# Patient Record
Sex: Male | Born: 1955 | Race: Black or African American | Hispanic: No | Marital: Married | Smoking: Never smoker
Health system: Southern US, Community
[De-identification: ages and names within clinical notes are randomized; demographics above are authoritative.]

## PROBLEM LIST (undated history)

## (undated) DIAGNOSIS — I10 Essential (primary) hypertension: Secondary | ICD-10-CM

## (undated) DIAGNOSIS — E119 Type 2 diabetes mellitus without complications: Secondary | ICD-10-CM

---

## 2019-02-24 ENCOUNTER — Other Ambulatory Visit: Payer: Self-pay | Admitting: Internal Medicine

## 2019-02-24 ENCOUNTER — Ambulatory Visit
Admission: RE | Admit: 2019-02-24 | Discharge: 2019-02-24 | Disposition: A | Discharge status: Home / Self Care | Payer: Self-pay | Source: Ambulatory Visit | Attending: Internal Medicine | Admitting: Internal Medicine

## 2019-02-24 DIAGNOSIS — M19041 Primary osteoarthritis, right hand: Secondary | ICD-10-CM

## 2020-09-12 ENCOUNTER — Ambulatory Visit: Payer: No Typology Code available for payment source | Admitting: Podiatry

## 2020-09-12 ENCOUNTER — Encounter: Payer: Self-pay | Admitting: Podiatry

## 2020-09-12 ENCOUNTER — Other Ambulatory Visit: Payer: Self-pay

## 2020-09-12 DIAGNOSIS — Z9989 Dependence on other enabling machines and devices: Secondary | ICD-10-CM | POA: Insufficient documentation

## 2020-09-12 DIAGNOSIS — E782 Mixed hyperlipidemia: Secondary | ICD-10-CM | POA: Insufficient documentation

## 2020-09-12 DIAGNOSIS — L84 Corns and callosities: Secondary | ICD-10-CM | POA: Diagnosis not present

## 2020-09-12 DIAGNOSIS — M109 Gout, unspecified: Secondary | ICD-10-CM | POA: Insufficient documentation

## 2020-09-12 DIAGNOSIS — E1136 Type 2 diabetes mellitus with diabetic cataract: Secondary | ICD-10-CM

## 2020-09-12 DIAGNOSIS — Z860100 Personal history of colon polyps, unspecified: Secondary | ICD-10-CM | POA: Insufficient documentation

## 2020-09-12 DIAGNOSIS — H9312 Tinnitus, left ear: Secondary | ICD-10-CM | POA: Insufficient documentation

## 2020-09-12 DIAGNOSIS — E1165 Type 2 diabetes mellitus with hyperglycemia: Secondary | ICD-10-CM | POA: Insufficient documentation

## 2020-09-12 DIAGNOSIS — I10 Essential (primary) hypertension: Secondary | ICD-10-CM | POA: Insufficient documentation

## 2020-09-12 DIAGNOSIS — H9313 Tinnitus, bilateral: Secondary | ICD-10-CM | POA: Insufficient documentation

## 2020-09-12 DIAGNOSIS — R209 Unspecified disturbances of skin sensation: Secondary | ICD-10-CM | POA: Insufficient documentation

## 2020-09-12 DIAGNOSIS — M19049 Primary osteoarthritis, unspecified hand: Secondary | ICD-10-CM | POA: Insufficient documentation

## 2020-09-12 DIAGNOSIS — G4730 Sleep apnea, unspecified: Secondary | ICD-10-CM | POA: Insufficient documentation

## 2020-09-12 NOTE — Progress Notes (Signed)
This patient presents to the office concerned about a black and callused area on the back of his right heel.  He says this has been present for the last few months and feels that the blackish discoloration is worsening.  He says he is having no pain or discomfort associated with this callus.  This patient is a diabetic and a patient of Dr. Sharl Ma.  He states that he believes this has become aggravated through his exercise program at home.  He presents to the office today for an evaluation and treatment of this condition.  Vascular  Dorsalis pedis and posterior tibial pulses are weakly  palpable  B/L.  Capillary return  WNL.  Temperature gradient is  WNL.  Skin turgor  WNL  Sensorium  Senn Weinstein monofilament wire  WNL. Normal tactile sensation.  Nail Exam  Patient has normal nails with no evidence of bacterial or fungal infection.  Orthopedic  Exam  Muscle tone and muscle strength  WNL.  No limitations of motion feet  B/L.  No crepitus or joint effusion noted.  Foot type is unremarkable and digits show no abnormalities.  HAV  B/L.  Skin  No open lesions.  Normal skin texture and turgor.  Hemmorhagic callus right heel on plantar lateral aspect right heel.  Callus right heel.  IE  Debride callus with dremel tool.  Told to use pumice stone.  Patient states he has changed his exercise shoes. Told him to return in future for continued diabetic foot exams. Patient has no evidence of vascular or neurologic pathology.   Helane Gunther DPM

## 2020-10-21 ENCOUNTER — Encounter (HOSPITAL_COMMUNITY): Payer: Self-pay

## 2020-10-21 ENCOUNTER — Emergency Department (HOSPITAL_COMMUNITY)
Admission: EM | Admit: 2020-10-21 | Discharge: 2020-10-21 | Disposition: A | Discharge status: Home / Self Care | Payer: No Typology Code available for payment source | Attending: Emergency Medicine | Admitting: Emergency Medicine

## 2020-10-21 ENCOUNTER — Emergency Department (HOSPITAL_COMMUNITY): Payer: No Typology Code available for payment source

## 2020-10-21 DIAGNOSIS — R519 Headache, unspecified: Secondary | ICD-10-CM

## 2020-10-21 DIAGNOSIS — I10 Essential (primary) hypertension: Secondary | ICD-10-CM | POA: Insufficient documentation

## 2020-10-21 DIAGNOSIS — Z794 Long term (current) use of insulin: Secondary | ICD-10-CM | POA: Insufficient documentation

## 2020-10-21 DIAGNOSIS — M25552 Pain in left hip: Secondary | ICD-10-CM | POA: Diagnosis not present

## 2020-10-21 DIAGNOSIS — Z79899 Other long term (current) drug therapy: Secondary | ICD-10-CM | POA: Insufficient documentation

## 2020-10-21 DIAGNOSIS — Z7984 Long term (current) use of oral hypoglycemic drugs: Secondary | ICD-10-CM | POA: Diagnosis not present

## 2020-10-21 DIAGNOSIS — E1136 Type 2 diabetes mellitus with diabetic cataract: Secondary | ICD-10-CM | POA: Insufficient documentation

## 2020-10-21 DIAGNOSIS — E1165 Type 2 diabetes mellitus with hyperglycemia: Secondary | ICD-10-CM | POA: Insufficient documentation

## 2020-10-21 DIAGNOSIS — Z7982 Long term (current) use of aspirin: Secondary | ICD-10-CM | POA: Diagnosis not present

## 2020-10-21 DIAGNOSIS — R030 Elevated blood-pressure reading, without diagnosis of hypertension: Secondary | ICD-10-CM

## 2020-10-21 HISTORY — DX: Essential (primary) hypertension: I10

## 2020-10-21 HISTORY — DX: Type 2 diabetes mellitus without complications: E11.9

## 2020-10-21 LAB — CBC WITH DIFFERENTIAL/PLATELET
Abs Immature Granulocytes: 0.01 10*3/uL (ref 0.00–0.07)
Basophils Absolute: 0 10*3/uL (ref 0.0–0.1)
Basophils Relative: 0 %
Eosinophils Absolute: 0.1 10*3/uL (ref 0.0–0.5)
Eosinophils Relative: 2 %
HCT: 44.1 % (ref 39.0–52.0)
Hemoglobin: 14 g/dL (ref 13.0–17.0)
Immature Granulocytes: 0 %
Lymphocytes Relative: 20 %
Lymphs Abs: 1.2 10*3/uL (ref 0.7–4.0)
MCH: 27.3 pg (ref 26.0–34.0)
MCHC: 31.7 g/dL (ref 30.0–36.0)
MCV: 86.1 fL (ref 80.0–100.0)
Monocytes Absolute: 1 10*3/uL (ref 0.1–1.0)
Monocytes Relative: 15 %
Neutro Abs: 3.9 10*3/uL (ref 1.7–7.7)
Neutrophils Relative %: 63 %
Platelets: 168 10*3/uL (ref 150–400)
RBC: 5.12 MIL/uL (ref 4.22–5.81)
RDW: 14.7 % (ref 11.5–15.5)
WBC: 6.2 10*3/uL (ref 4.0–10.5)
nRBC: 0 % (ref 0.0–0.2)

## 2020-10-21 LAB — URINALYSIS, ROUTINE W REFLEX MICROSCOPIC
Bacteria, UA: NONE SEEN
Bilirubin Urine: NEGATIVE
Glucose, UA: >=500 mg/dL — AB
Hgb urine dipstick: NEGATIVE
Ketones, ur: NEGATIVE mg/dL
Leukocytes,Ua: NEGATIVE
Nitrite: NEGATIVE
Protein, ur: NEGATIVE mg/dL
Specific Gravity, Urine: 1.014 (ref 1.005–1.030)
pH: 6 (ref 5.0–8.0)

## 2020-10-21 LAB — COMPREHENSIVE METABOLIC PANEL
ALT: 12 U/L (ref 0–44)
AST: 19 U/L (ref 15–41)
Albumin: 4.1 g/dL (ref 3.5–5.0)
Alkaline Phosphatase: 47 U/L (ref 38–126)
Anion gap: 10 (ref 5–15)
BUN: 15 mg/dL (ref 8–23)
CO2: 27 mmol/L (ref 22–32)
Calcium: 9.4 mg/dL (ref 8.9–10.3)
Chloride: 101 mmol/L (ref 98–111)
Creatinine, Ser: 1.25 mg/dL — ABNORMAL HIGH (ref 0.61–1.24)
GFR, Estimated: >60 mL/min (ref >60)
Glucose, Bld: 99 mg/dL (ref 70–99)
Potassium: 4.2 mmol/L (ref 3.5–5.1)
Sodium: 138 mmol/L (ref 135–145)
Total Bilirubin: 0.5 mg/dL (ref 0.3–1.2)
Total Protein: 7.8 g/dL (ref 6.5–8.1)

## 2020-10-21 LAB — TROPONIN I (HIGH SENSITIVITY): Troponin I (High Sensitivity): 6 ng/L (ref <18)

## 2020-10-21 MED ORDER — AMLODIPINE BESYLATE 5 MG PO TABS
10.0000 mg | ORAL_TABLET | Freq: Once | ORAL | Status: AC
  Administered 2020-10-21: 10 mg via ORAL
  Filled 2020-10-21: qty 2

## 2020-10-21 MED ORDER — DIPHENHYDRAMINE HCL 50 MG/ML IJ SOLN
12.5000 mg | Freq: Once | INTRAMUSCULAR | Status: AC
Start: 2020-10-21 — End: 2020-10-21
  Administered 2020-10-21: 12.5 mg via INTRAVENOUS
  Filled 2020-10-21: qty 1

## 2020-10-21 MED ORDER — PROCHLORPERAZINE EDISYLATE 10 MG/2ML IJ SOLN
10.0000 mg | Freq: Once | INTRAMUSCULAR | Status: AC
  Administered 2020-10-21: 10 mg via INTRAVENOUS
  Filled 2020-10-21: qty 2

## 2020-10-21 MED ORDER — METOPROLOL SUCCINATE ER 50 MG PO TB24
50.0000 mg | ORAL_TABLET | Freq: Every day | ORAL | Status: DC
  Administered 2020-10-21: 50 mg via ORAL
  Filled 2020-10-21: qty 1

## 2020-10-21 MED ORDER — LISINOPRIL 20 MG PO TABS
40.0000 mg | ORAL_TABLET | Freq: Once | ORAL | Status: AC
  Administered 2020-10-21: 40 mg via ORAL
  Filled 2020-10-21: qty 2

## 2020-10-21 MED ORDER — SODIUM CHLORIDE 0.9 % IV BOLUS
1000.0000 mL | Freq: Once | INTRAVENOUS | Status: AC
  Administered 2020-10-21: 1000 mL via INTRAVENOUS

## 2020-10-21 MED ORDER — KETOROLAC TROMETHAMINE 15 MG/ML IJ SOLN
15.0000 mg | Freq: Once | INTRAMUSCULAR | Status: AC
  Administered 2020-10-21: 15 mg via INTRAVENOUS
  Filled 2020-10-21: qty 1

## 2020-10-21 NOTE — ED Provider Notes (Signed)
[facility]-EMERGENCY DEPT Provider Note   CSN: [id number] Arrival date & time: 10/21/20  0518     History Chief Complaint  Patient presents with  . Hypertension    Gregory PughKelvin Rasmussen is a 65 y.o. male with a past medical history significant for diabetes and hypertension who presents to the ED due to concerns about elevated blood pressure associated with headache and blurred vision x1 day.  Patient states he has had a persistent left-sided dull headache that radiates into the left side of neck.  Patient states headache occasionally becomes throbbing in nature associated with bilateral blurred vision.  Patient notes he checked his blood pressure when his headache began which was elevated in the 170s/80s.  He is currently on lisinopril, amlodipine, and metoprolol which he has been compliant with.  He took over-the-counter ibuprofen for his headache with no relief in symptoms.  Patient denies changes to speech, unilateral weakness, and dizziness.  He notes his blood pressures typically run in the 130s/70s.  Denies chest pain and shortness of breath.  No difficulties urinating.  No aggravating or alleviating symptoms.  Last took over-the-counter pain medication last night.  Patient also admits to left-sided hip pain x4 days.  Patient states pain started after walking 4 miles. He notes he has increased his physical activity recently.  Denies radiation of pain.  Denies fever and chills.  No history of IV drug use.  Denies numbness/tingling left lower extremity.  Denies saddle paresthesias, bowel/bladder incontinence, lower extremity weakness, lower extremity numbness/tingling.  No injury to left hip. Taking OTC NSAIDs for hip pain with moderate relief.  History obtained from patient and past medical records. No interpreter used during encounter.      Past Medical History:  Diagnosis Date  . Diabetes mellitus without complication (HCC)   . Hypertension     Patient Active Problem  List   Diagnosis Date Noted  . 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 Acute gout of right hand 09/12/2020  . Bilateral tinnitus 09/12/2020  . 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 Essential hypertension 09/12/2020  . Gout 09/12/2020  . Hyperglycemia due to type 2 diabetes mellitus (HCC) 09/12/2020  . Mixed hyperlipidemia 09/12/2020  . Localized, primary osteoarthritis of hand 09/12/2020  . Morbid obesity (HCC) 09/12/2020  . Sleep apnea 09/12/2020  . Personal history of colonic polyps 09/12/2020  . Skin sensation disturbance 09/12/2020  . Callus of heel 09/12/2020    No past surgical history on file.     No family history on file.  Social History   Tobacco Use  . Smoking status: Never Smoker  . Smokeless tobacco: Current User  Substance Use Topics  . Alcohol use: Not Currently  . Drug use: Not Currently    Home Medications Prior to Admission medications   Medication Sig Start Date End Date Taking? Authorizing Provider  amLODipine (NORVASC) 10 MG tablet Take 1 tablet by mouth daily.   Yes [provider]  aspirin 81 MG chewable tablet Chew 81 mg by mouth at bedtime.   Yes [provider]  Empagliflozin-metFORMIN HCl ER (SYNJARDY XR) 12-998 MG TB24 Take 1 tablet by mouth in the morning and at bedtime. 02/27/20  Yes [provider]  liraglutide (VICTOZA) 18 MG/3ML SOPN Inject 18 mg into the skin daily.   Yes [provider]  lisinopril (ZESTRIL) 40 MG tablet Take 40 mg by mouth daily. 08/29/19  Yes [provider]  metoprolol succinate (  TOPROL-XL) 50 MG 24 hr tablet Take 50 mg by mouth daily.   Yes [provider]  pravastatin (PRAVACHOL) 10 MG tablet Take 10 mg by mouth daily.   Yes [provider]  allopurinol (ZYLOPRIM) 100 MG tablet Take 1 tablet by mouth daily as needed (gout).    [provider]  colchicine 0.6 MG tablet Take 0.6 mg by mouth  daily as needed (gout).    [provider]  melatonin 5 MG TABS Take 5 mg by mouth at bedtime as needed (sleep).    [provider]    Allergies    Patient has no known allergies.  Review of Systems   Review of Systems  Eyes: Positive for visual disturbance.  Respiratory: Negative for shortness of breath.   Cardiovascular: Negative for chest pain.  Gastrointestinal: Negative for abdominal pain, diarrhea, nausea and vomiting.  Neurological: Positive for headaches. Negative for dizziness, facial asymmetry, speech difficulty and weakness.  All other systems reviewed and are negative.   Physical Exam Updated Vital Signs BP (!) 125/57   Pulse 64   Temp 98.1 F (36.7 C) (Oral)   Resp 18   Ht 5\' 9"  (1.753 m)   Wt 108 kg   SpO2 100%   BMI 35.15 kg/m   Physical Exam Vitals and nursing note reviewed.  Constitutional:      General: He is not in acute distress.    Appearance: He is not ill-appearing.  HENT:     Head: Normocephalic.  Eyes:     Extraocular Movements: Extraocular movements intact.     Pupils: Pupils are equal, round, and reactive to light.  Cardiovascular:     Rate and Rhythm: Normal rate and regular rhythm.     Pulses: Normal pulses.     Heart sounds: Normal heart sounds. No murmur heard. No friction rub. No gallop.   Pulmonary:     Effort: Pulmonary effort is normal.     Breath sounds: Normal breath sounds.  Abdominal:     General: Abdomen is flat. Bowel sounds are normal. There is no distension.     Palpations: Abdomen is soft.     Tenderness: There is no abdominal tenderness. There is no guarding or rebound.  Musculoskeletal:     Cervical back: Neck supple.     Comments: Mild bony tenderness over left hip. Full ROM of left hip. Distal pulses and sensation intact.   Skin:    General: Skin is warm and dry.  Neurological:     General: No focal deficit present.     Mental Status: He is alert.     Comments: Speech is clear, able to follow  commands CN III-XII intact Normal strength in upper and lower extremities bilaterally including dorsiflexion and plantar flexion, strong and equal grip strength Sensation grossly intact throughout Moves extremities without ataxia, coordination intact No pronator drift  Psychiatric:        Mood and Affect: Mood normal.        Behavior: Behavior normal.     ED Results / Procedures / Treatments   Labs (all labs ordered are listed, but only abnormal results are displayed) Labs Reviewed  COMPREHENSIVE METABOLIC PANEL - Abnormal; Notable for the following components:      Result Value   Creatinine, Ser 1.25 (*)    All other components within normal limits  URINALYSIS, ROUTINE W REFLEX MICROSCOPIC - Abnormal; Notable for the following components:   Glucose, UA >=500 (*)    All other  components within normal limits  CBC WITH DIFFERENTIAL/PLATELET  TROPONIN I (HIGH SENSITIVITY)    EKG None  Radiology CT Head Wo Contrast  Result Date: 10/21/2020 CLINICAL DATA:  Cerebral hemorrhage suspected Headache Blurred vision since yesterday Hypertension EXAM: CT HEAD WITHOUT CONTRAST TECHNIQUE: Contiguous axial images were obtained from the base of the skull through the vertex without intravenous contrast. COMPARISON:  None. FINDINGS: Brain: No evidence of acute infarction, hemorrhage, hydrocephalus, extra-axial collection or mass lesion/mass effect. Vascular: No hyperdense vessel or unexpected calcification. Skull: Normal. Negative for fracture or focal lesion. Sinuses/Orbits: No acute abnormality. 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 Other: Mild nonspecific thickening of the scalp at the vertex. IMPRESSION: No acute intracranial abnormality. Electronically Signed   By: Acquanetta Belling M.D.   On: 10/21/2020 08:15   DG Hip Unilat W or Wo Pelvis 2-3 Views Left  Result Date: 10/21/2020 CLINICAL DATA:  Worsening left hip pain since workout. EXAM: DG HIP (WITH OR WITHOUT  PELVIS) 2-3V LEFT COMPARISON:  None. FINDINGS: Pelvic enthesophytes. Lumbar spondylosis. No evidence of fracture or bone lesion. Negative for erosion or left hip joint narrowing. IMPRESSION: No acute finding or left hip narrowing. Electronically Signed   By: Marnee Spring M.D.   On: 10/21/2020 07:55    Procedures Procedures   Medications Ordered in ED Medications  metoprolol succinate (TOPROL-XL) 24 hr tablet 50 mg (50 mg Oral Given 10/21/20 0904)  prochlorperazine (COMPAZINE) injection 10 mg (10 mg Intravenous Given 10/21/20 0737)  diphenhydrAMINE (BENADRYL) injection 12.5 mg (12.5 mg Intravenous Given 10/21/20 0737)  sodium chloride 0.9 % bolus 1,000 mL (0 mLs Intravenous Stopped 10/21/20 0858)  ketorolac (TORADOL) 15 MG/ML injection 15 mg (15 mg Intravenous Given 10/21/20 0904)  amLODipine (NORVASC) tablet 10 mg (10 mg Oral Given 10/21/20 0904)  lisinopril (ZESTRIL) tablet 40 mg (40 mg Oral Given 10/21/20 1607)    ED Course  I have reviewed the triage vital signs and the nursing notes.  Pertinent labs & imaging results that were available during my care of the patient were reviewed by me and considered in my medical decision making (see chart for details).  Clinical Course as of 10/21/20 1103  Mon Oct 21, 2020  0638 BP(!): 163/70 [CA]    Clinical Course User Index [CA] Mannie Stabile, PA-C   MDM Rules/Calculators/A&P                         65 year old male presents to the ED due to elevated blood pressure associated with left-sided headache and blurry vision.  Patient has a history of hypertension is currently on lisinopril, amlodipine, metoprolol which she has been compliant with.  Denies chest pain, shortness of breath, dizziness, speech changes, unilateral weakness.  He is not currently on blood thinners.  No head injury.  Upon arrival, BP elevated 187/74 which is improved during my initial evaluation to 163/70.  All other vitals within normal limits.  Patient in no acute  distress and nontoxic-appearing.  Physical exam reassuring.  Normal neurological exam.  Low suspicion for CVA.  Mild bony tenderness over left hip.  No cervical, thoracic, or lumbar midline tenderness.  Left lower extremity neurovascularly intact.  Low suspicion for septic arthritis.  Suspect underlying arthritis vs. strain of left hip due to increased physical activity.  Will obtain x-ray to rule out bony fractures and to check joint space.  Also obtain routine labs to rule out end organ damage.  CT head to rule out intracranial abnormalities due to elevated blood pressures associated with blurred vision.  IV fluids, Compazine, and Benadryl given.  We will hold Toradol until CT head results become available.  Patient agreeable to plan.  CBC unremarkable with no leukocytosis and normal hemoglobin.  CMP reassuring.  Mild elevation in creatinine at 1.25 with normal BUN.  No major electrolyte derangements.  Troponin normal at 6.  Low suspicion for ACS.  Patient denies chest pain.  No delta troponin warranted at this time.  CT head personally reviewed which is negative for any acute abnormalities.  Low suspicion for [facility].  Left hip x-ray personally reviewed which is negative for any acute abnormalities. No signs of end organ damage. Low suspicion for HTN emergency. Elevated BP could be related to NSAIDs for left hip pain. Discussed case with Dr. Donnald Garre who evaluated patient at bedside and agrees with assessment and plan.   8:46 AM reassessed patient at bedside who notes almost full resolution in headache.  BP elevated at 200/89.  Patient notes he is due for his blood pressure medications.  Will give home dose here in the ED and monitor blood pressure.  IV Toradol given.  Patient calling PCP to schedule appointment for further evaluation of blood pressure.  11:00 AM reassessed patient at bedside.  Patient's BP improved to 125/57.  Patient admits to resolution in headache after migraine cocktail.  Low suspicion for  hypertensive emergency.  Instructed patient to take Tylenol as needed for pain to prevent further elevation of BP.  Follow-up with PCP within the next few days to recheck blood pressure. Strict ED precautions discussed with patient. Patient states understanding and agrees to plan. Patient discharged home in no acute distress and stable vitals  Final Clinical Impression(s) / ED Diagnoses Final diagnoses:  Elevated blood pressure reading  Acute nonintractable headache, unspecified headache type    Rx / DC Orders ED Discharge Orders    None       Jesusita Oka 10/21/20 1103    Arby Barrette, MD 10/21/20 1718

## 2020-10-21 NOTE — ED Provider Notes (Signed)
I provided a substantive portion of the care of this patient.  I personally performed the entirety of the history for this encounter.    Patient was blood pressure has been elevated.  He had a headache this morning.  Headache was intense frontal headache aching in nature.  Reports his vision was also slightly blurred.  No focal weakness numbness or tingling.  Patient reports he measured his blood pressures and they were elevated up to 170 systolic.  He reports this is very atypical for him.  Typically blood pressures are controlled on his medications in the 130s.  He does note that at the beginning of the week he exercised pretty aggressively on the treadmill and then developed left hip pain.  He has been taking frequent and high doses of ibuprofen.  He reports that the pain in the hip is starting to improve now.  Patient is alert and appropriate.  No confusion.  All movements coordinated purposeful symmetric.  CT head negative.  Diagnostic studies within normal limits.  At this time appears most likely patient's hypertensive urgency precipitated by recent high-dose use of NSAIDs.  Patient is alert and appropriate.  No signs of encephalopathy.  At this time will administer his a.m. blood pressures, recheck blood pressure and anticipate likely discharge with close follow-up.   Arby Barrette, MD 10/21/20 [phone]

## 2020-10-21 NOTE — ED Triage Notes (Signed)
Pt came via POV with HTN. Pt states his B/P was 170s/90s at home. He is c/o headache and blurred vision and states it woke him up out of his sleep. Pt pressure upon arrival 180s/70s.

## 2020-10-21 NOTE — Discharge Instructions (Signed)
As discussed, all of your labs were reassuring today.  Your CT head was negative for any acute abnormalities.  Continue to take Tylenol as needed for your left hip pain.  Please follow-up with PCP within the next few days to recheck blood pressure.  Take blood pressure medications as prescribed.  Return to the ER for new or worsening symptoms.

## 2021-09-08 DIAGNOSIS — E1169 Type 2 diabetes mellitus with other specified complication: Secondary | ICD-10-CM | POA: Diagnosis not present

## 2021-09-08 DIAGNOSIS — E782 Mixed hyperlipidemia: Secondary | ICD-10-CM | POA: Diagnosis not present

## 2021-09-08 DIAGNOSIS — Z125 Encounter for screening for malignant neoplasm of prostate: Secondary | ICD-10-CM | POA: Diagnosis not present

## 2021-09-08 DIAGNOSIS — M109 Gout, unspecified: Secondary | ICD-10-CM | POA: Diagnosis not present

## 2021-09-08 DIAGNOSIS — Z0000 Encounter for general adult medical examination without abnormal findings: Secondary | ICD-10-CM | POA: Diagnosis not present

## 2021-09-08 DIAGNOSIS — I10 Essential (primary) hypertension: Secondary | ICD-10-CM | POA: Diagnosis not present

## 2021-09-08 DIAGNOSIS — N1831 Chronic kidney disease, stage 3a: Secondary | ICD-10-CM | POA: Diagnosis not present

## 2021-09-17 DIAGNOSIS — E119 Type 2 diabetes mellitus without complications: Secondary | ICD-10-CM | POA: Diagnosis not present

## 2021-09-17 DIAGNOSIS — E1169 Type 2 diabetes mellitus with other specified complication: Secondary | ICD-10-CM | POA: Diagnosis not present

## 2021-09-17 DIAGNOSIS — I10 Essential (primary) hypertension: Secondary | ICD-10-CM | POA: Diagnosis not present

## 2021-09-17 DIAGNOSIS — N1831 Chronic kidney disease, stage 3a: Secondary | ICD-10-CM | POA: Diagnosis not present

## 2021-11-05 DIAGNOSIS — E11319 Type 2 diabetes mellitus with unspecified diabetic retinopathy without macular edema: Secondary | ICD-10-CM | POA: Diagnosis not present

## 2022-03-24 DIAGNOSIS — N1831 Chronic kidney disease, stage 3a: Secondary | ICD-10-CM | POA: Diagnosis not present

## 2022-03-24 DIAGNOSIS — I10 Essential (primary) hypertension: Secondary | ICD-10-CM | POA: Diagnosis not present

## 2022-03-24 DIAGNOSIS — E1169 Type 2 diabetes mellitus with other specified complication: Secondary | ICD-10-CM | POA: Diagnosis not present

## 2022-03-24 DIAGNOSIS — R809 Proteinuria, unspecified: Secondary | ICD-10-CM | POA: Diagnosis not present

## 2022-03-31 DIAGNOSIS — N182 Chronic kidney disease, stage 2 (mild): Secondary | ICD-10-CM | POA: Diagnosis not present

## 2022-03-31 DIAGNOSIS — E1169 Type 2 diabetes mellitus with other specified complication: Secondary | ICD-10-CM | POA: Diagnosis not present

## 2022-03-31 DIAGNOSIS — E119 Type 2 diabetes mellitus without complications: Secondary | ICD-10-CM | POA: Diagnosis not present

## 2022-03-31 DIAGNOSIS — I10 Essential (primary) hypertension: Secondary | ICD-10-CM | POA: Diagnosis not present

## 2022-05-08 DIAGNOSIS — Z23 Encounter for immunization: Secondary | ICD-10-CM | POA: Diagnosis not present

## 2022-05-08 DIAGNOSIS — S90822A Blister (nonthermal), left foot, initial encounter: Secondary | ICD-10-CM | POA: Diagnosis not present

## 2022-05-08 DIAGNOSIS — S90821A Blister (nonthermal), right foot, initial encounter: Secondary | ICD-10-CM | POA: Diagnosis not present

## 2022-05-14 DIAGNOSIS — T148XXA Other injury of unspecified body region, initial encounter: Secondary | ICD-10-CM | POA: Diagnosis not present

## 2022-05-14 DIAGNOSIS — M79673 Pain in unspecified foot: Secondary | ICD-10-CM | POA: Diagnosis not present

## 2022-05-21 DIAGNOSIS — E11621 Type 2 diabetes mellitus with foot ulcer: Secondary | ICD-10-CM | POA: Diagnosis not present

## 2022-05-21 DIAGNOSIS — E119 Type 2 diabetes mellitus without complications: Secondary | ICD-10-CM | POA: Diagnosis not present

## 2022-05-21 DIAGNOSIS — N182 Chronic kidney disease, stage 2 (mild): Secondary | ICD-10-CM | POA: Diagnosis not present

## 2022-05-22 ENCOUNTER — Other Ambulatory Visit: Payer: Self-pay | Admitting: Internal Medicine

## 2022-05-22 ENCOUNTER — Ambulatory Visit
Admission: RE | Admit: 2022-05-22 | Discharge: 2022-05-22 | Disposition: A | Discharge status: Home / Self Care | Payer: BC Managed Care – PPO | Source: Ambulatory Visit | Attending: Internal Medicine | Admitting: Internal Medicine

## 2022-05-22 DIAGNOSIS — E13621 Other specified diabetes mellitus with foot ulcer: Secondary | ICD-10-CM

## 2022-05-22 DIAGNOSIS — L97419 Non-pressure chronic ulcer of right heel and midfoot with unspecified severity: Secondary | ICD-10-CM

## 2022-05-25 ENCOUNTER — Ambulatory Visit: Payer: BC Managed Care – PPO | Admitting: Podiatry

## 2022-05-25 DIAGNOSIS — E1136 Type 2 diabetes mellitus with diabetic cataract: Secondary | ICD-10-CM | POA: Diagnosis not present

## 2022-05-25 DIAGNOSIS — L97412 Non-pressure chronic ulcer of right heel and midfoot with fat layer exposed: Secondary | ICD-10-CM

## 2022-05-25 DIAGNOSIS — L97421 Non-pressure chronic ulcer of left heel and midfoot limited to breakdown of skin: Secondary | ICD-10-CM | POA: Diagnosis not present

## 2022-05-25 IMAGING — CT CT HEAD W/O CM
3 series · 14 of 47 positions shown, 16 images · non-contrast
Comparison: None.

CLINICAL DATA: Cerebral hemorrhage suspected

Headache
Blurred vision since yesterday
Hypertension
EXAM:
CT HEAD WITHOUT CONTRAST
TECHNIQUE: Contiguous axial images were obtained from the base of the skull
through the vertex without intravenous contrast.

[Series 2: head wo · axial · 0.47mm/px · z∈[-106,+24]mm · 8 of 32 slices shown, 10 images]
[im 3/32  brain]
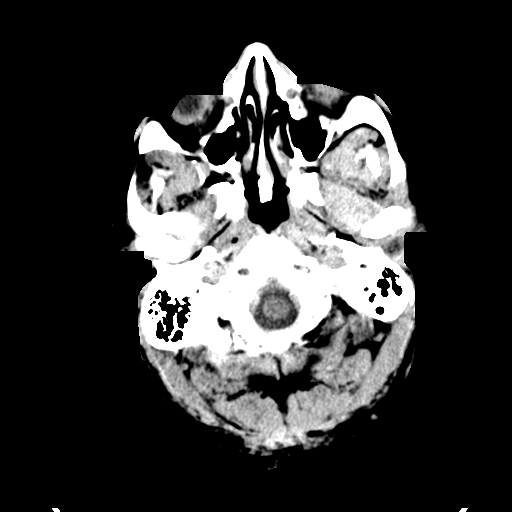
[im 3/32  bone]
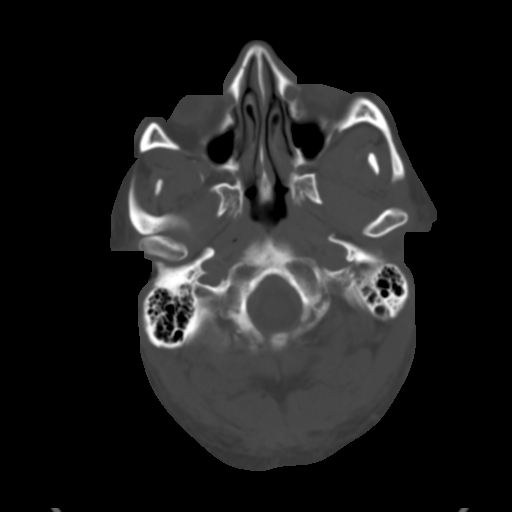
[im 7/32  brain]
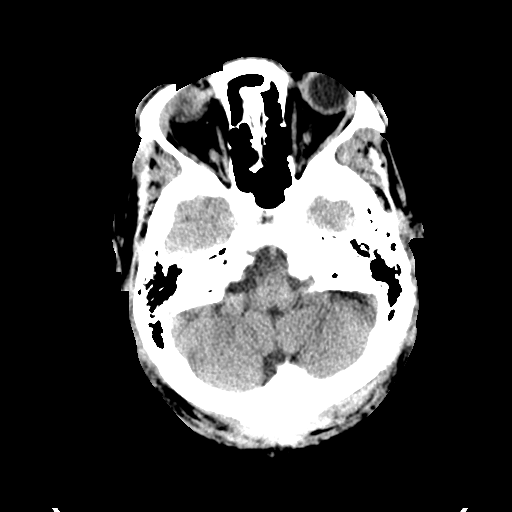
[im 10/32  brain]
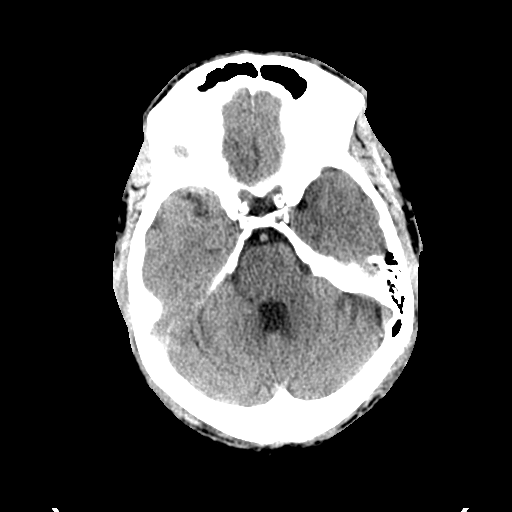
[im 14/32  brain]
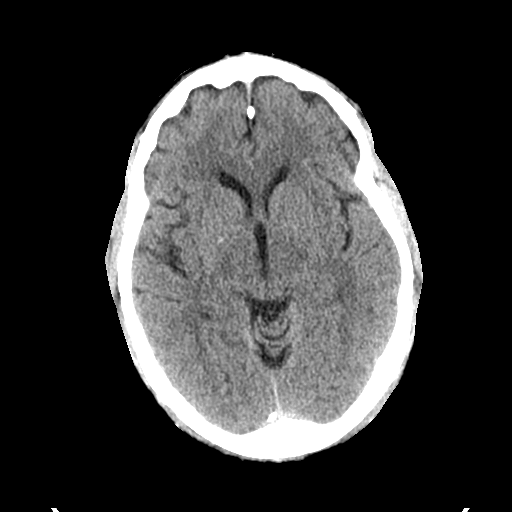
[im 18/32  brain]
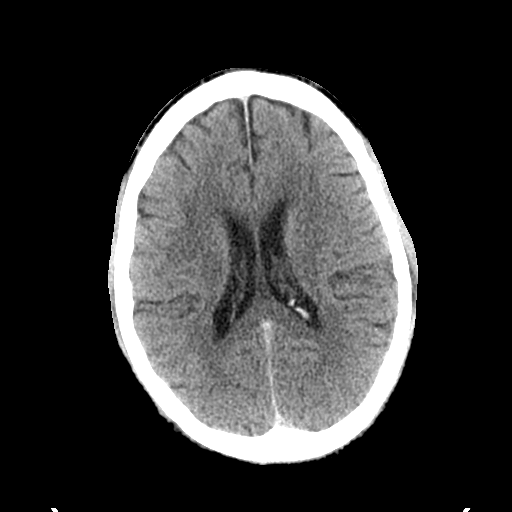
[im 18/32  bone]
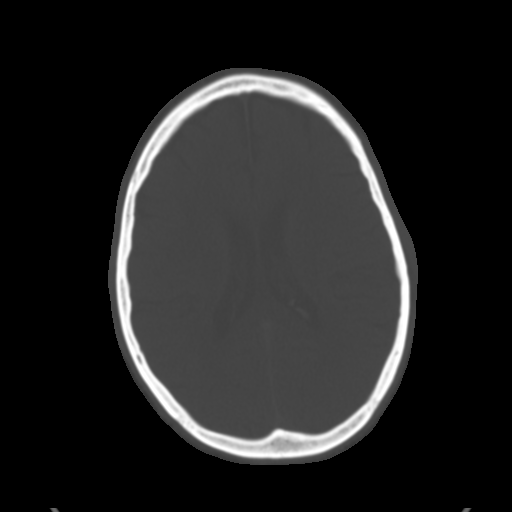
[im 22/32  brain]
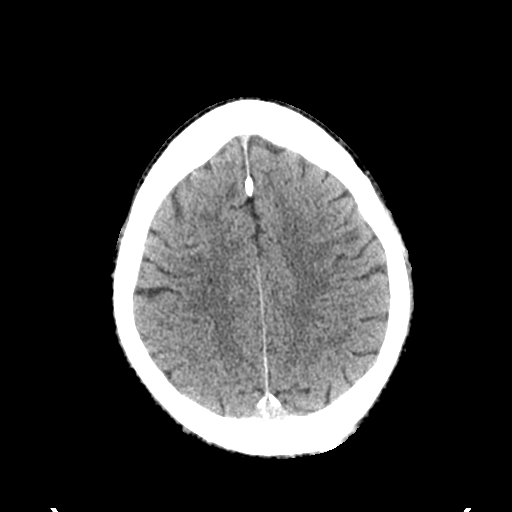
[im 25/32  brain]
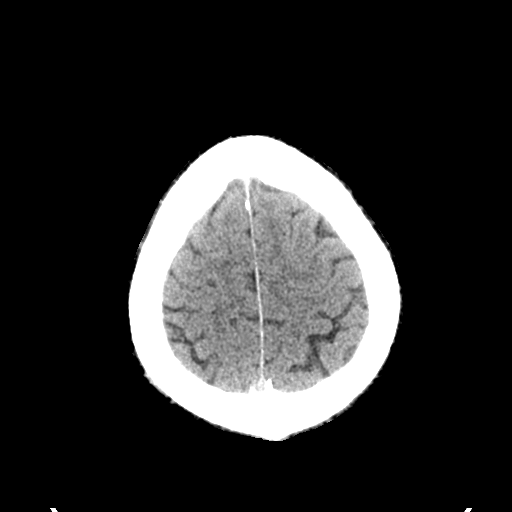
[im 29/32  brain]
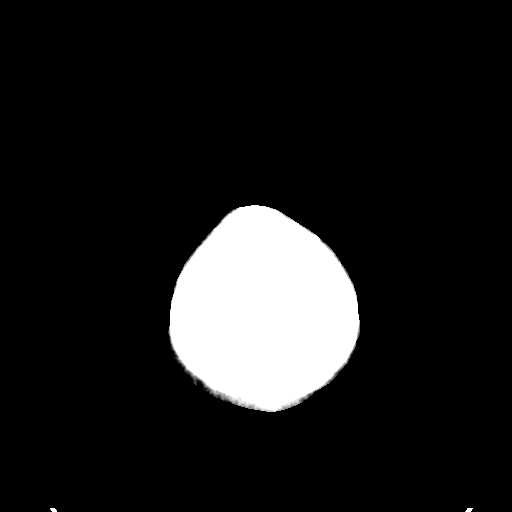

[Series 5: coronal soft tissue · coronal · 0.33mm/px · 3 of 72 slices shown]
[im 24/72  brain]
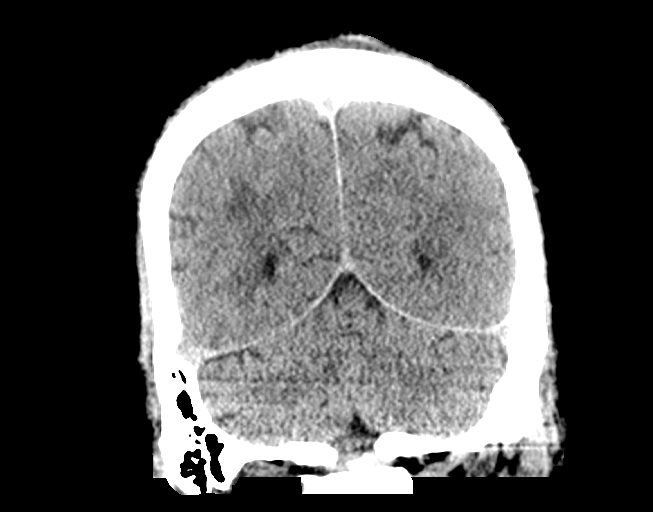
[im 32/72  brain]
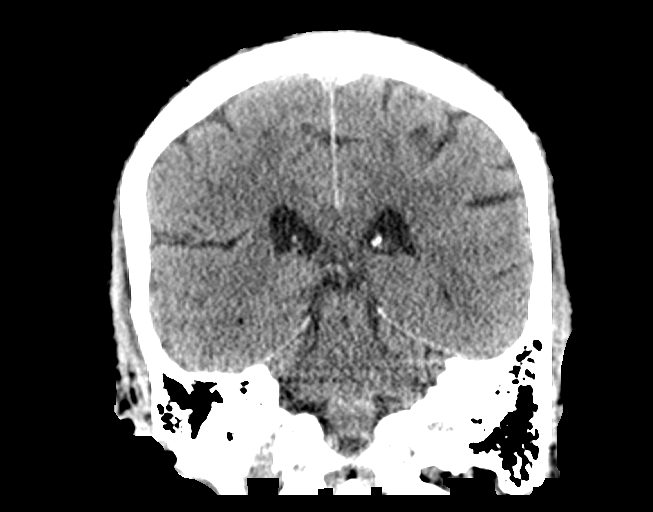
[im 40/72  brain]
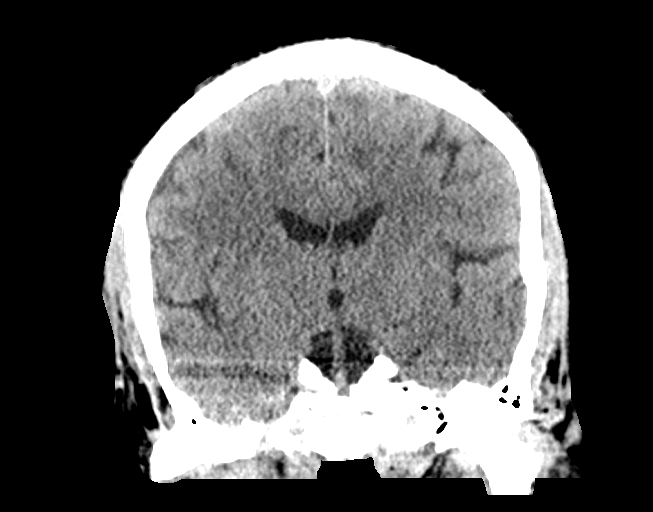

[Series 6: sagittal soft tissue · sagittal · 0.31mm/px · 3 of 62 slices shown]
[im 21/62  brain]
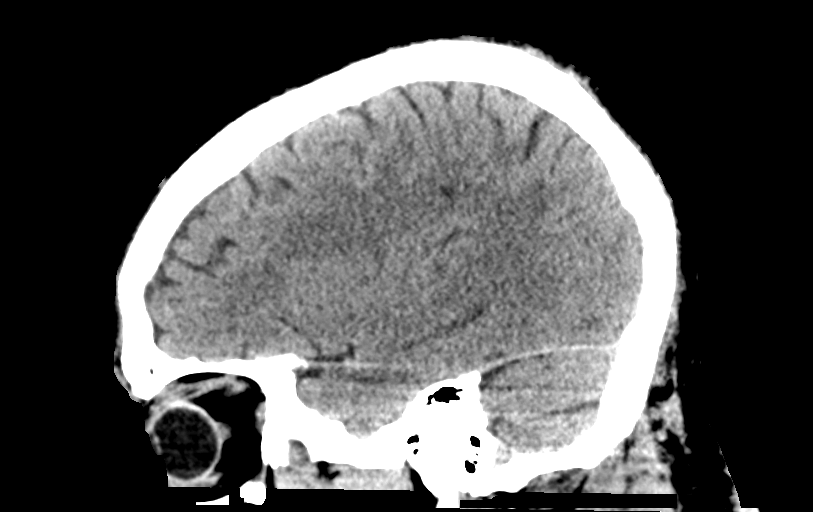
[im 31/62  brain]
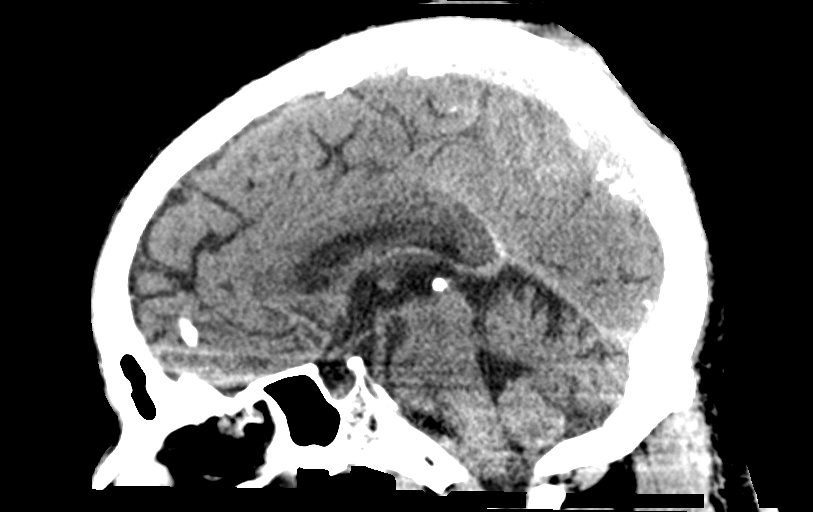
[im 41/62  brain]
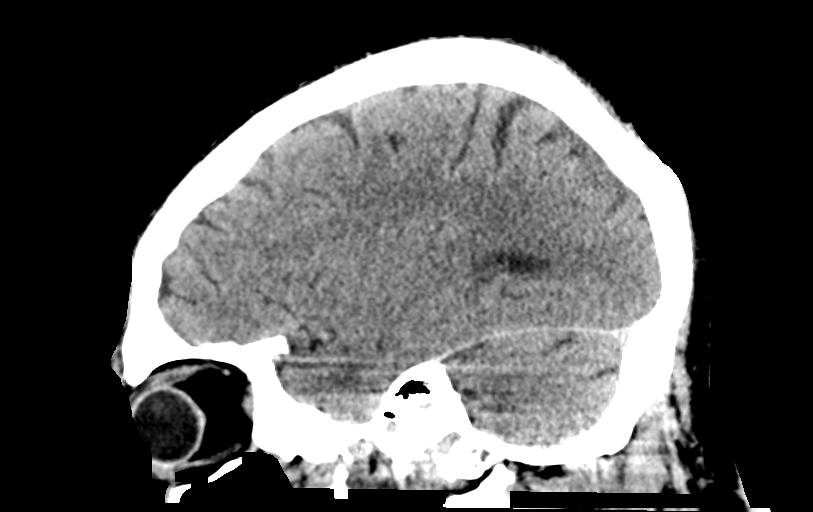

[14 of 47 positions shown; findings below may reference images not displayed]

FINDINGS: Brain: No evidence of acute infarction, hemorrhage, hydrocephalus,
extra-axial collection or mass lesion/mass effect.

Vascular: No hyperdense vessel or unexpected calcification.

Skull: Normal. Negative for fracture or focal lesion.

Sinuses/Orbits: No acute abnormality. 1.3 cm calcified mass in the
right frontal sinus likely an osteoma.

Other: Mild nonspecific thickening of the scalp at the vertex.
IMPRESSION: No acute intracranial abnormality.

## 2022-05-25 NOTE — Progress Notes (Signed)
Subjective:  Patient ID: [facility], male    DOB: 10-May-1956,  MRN: [id number]  Chief Complaint  Patient presents with   Callouses    Pre-ulcerative callus verses ulcer of both heel - patient has seen Dr. Stacie Acres in the past    66 y.o. male presents with the above complaint. History confirmed with patient.  He returns for a new issue that developed about 5 weeks ago he wore a an over-the-counter insert in his shoes and was on a hike and noticed that he developed blistering on the heel the right was much worse on the left at this caused a break in the skin and the blister expanded and went up into the ankles.  He saw his endocrinologist for this and he put him on antibiotics and took x-rays and took lab work.  His A1c is well controlled and always has been, he is no longer on insulin  Objective:  Physical Exam: warm, good capillary refill, no trophic changes or ulcerative lesions, normal DP and PT pulses, normal monofilament exam, normal sensory exam, and bilateral heel ulcerations noted below measuring 0.6 x 0.6 cm x 0.1 cm and limited to breakdown of skin on the left side, right side is 4.5 x 3.3 x 0.2 cm with exposed subcutaneous tissue, appears to be healing well there is large hyperkeratosis surrounding this no cellulitis purulence or malodor there is slight serous drainage and tenderness to the area      Radiographs: Multiple views x-ray of both feet: I reviewed his previous radiographs taken by his endocrinologist on 05/22/2022 there is no evidence of osteomyelitis or soft tissue emphysema Assessment:   1. 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 Ulcer of heel, left, limited to breakdown of skin (HCC)   3. Type 2 diabetes mellitus with diabetic cataract, without long-term current use of insulin (HCC)      Plan:  Patient was evaluated and treated and all questions answered.  Ulcer bilateral heel -We discussed the etiology and factors that are a part of  the wound healing process.  We also discussed the risk of infection both soft tissue and osteomyelitis from open ulceration.  Discussed the risk of limb loss if this happens or worsens. -Debridement as below. -Dressed with Medihoney, DSD.  He has been using this at home and he will continue this as well -Continue home dressing changes daily with 4 x 4 gauze and Medihoney -Continue off-loading is much as possible, I think he can continue his regular shoe gear which is a soft open-ended croc type sandal.  We discussed avoiding exercise on his feet for now until these heal -Vascular testing is not indicated he has palpable pulses -HgbA1c: Well-controlled -Last antibiotics: Has completed 2 rounds of antibiotics, I do not think he requires further oral antibiotics at this point -Imaging: x-ray reviewed, shows no signs of erosions, osteolysis, osteomyelitis or emphysema.  Procedure: Excisional Debridement of Wound Rationale: Removal of non-viable soft tissue from the wound to promote healing.  Anesthesia: none Post-Debridement Wound Measurements: 4.5 cm x 0.3 cm x 0.2 cm on the right heel, he has skin breakdown 0.6 cm x 0.6 cm x0.1 cm in the left heel Type of Debridement: Sharp Excisional Tissue Removed: Non-viable soft tissue Depth of Debridement: subcutaneous tissue. Technique: Sharp excisional debridement to bleeding, viable wound base.  Dressing: Dry, sterile, compression dressing. Disposition: Patient tolerated procedure well.    Return in about 1 month (around 06/24/2022) for wound care.  Return in about 1 month (around 06/24/2022) for wound care.

## 2022-05-29 DIAGNOSIS — E11621 Type 2 diabetes mellitus with foot ulcer: Secondary | ICD-10-CM | POA: Diagnosis not present

## 2022-05-29 DIAGNOSIS — N182 Chronic kidney disease, stage 2 (mild): Secondary | ICD-10-CM | POA: Diagnosis not present

## 2022-05-29 DIAGNOSIS — E119 Type 2 diabetes mellitus without complications: Secondary | ICD-10-CM | POA: Diagnosis not present

## 2022-05-29 DIAGNOSIS — E1140 Type 2 diabetes mellitus with diabetic neuropathy, unspecified: Secondary | ICD-10-CM | POA: Diagnosis not present

## 2022-06-23 ENCOUNTER — Encounter: Payer: Self-pay | Admitting: Podiatry

## 2022-06-23 ENCOUNTER — Ambulatory Visit: Payer: BC Managed Care – PPO | Admitting: Podiatry

## 2022-06-23 DIAGNOSIS — L97412 Non-pressure chronic ulcer of right heel and midfoot with fat layer exposed: Secondary | ICD-10-CM | POA: Diagnosis not present

## 2022-06-23 NOTE — Patient Instructions (Signed)
Look for urea 40% cream or ointment and apply to the thickened dry skin / calluses. This can be bought over the counter, at a pharmacy or online such as [employer].  

## 2022-06-24 ENCOUNTER — Telehealth: Payer: Self-pay | Admitting: *Deleted

## 2022-06-24 NOTE — Telephone Encounter (Signed)
Patient is calling for status of the colloidal gel that, was supposed to be sent to his pharmacy,not seeing this in notes,Please advise.

## 2022-06-24 NOTE — Progress Notes (Signed)
  Subjective:  Patient ID: Gregory Rasmussen, male    DOB: 1956/06/08,  MRN: [id number]  Chief Complaint  Patient presents with   Foot Ulcer    Follow up ulcer bilateral plantar heels   "The left one is pretty much healed, the right one still has a scab and very sensitive"    66 y.o. male presents with the above complaint. History confirmed with patient.  He says is doing well the left one no longer drains and has healed, the right still is quite sensitive and has a dark tissue in the middle  Objective:  Physical Exam: warm, good capillary refill, no trophic changes or ulcerative lesions, normal DP and PT pulses, normal monofilament exam, normal sensory exam, and right heel ulceration noted below measuring 3.2x2.2 x 0.2 cm with exposed subcutaneous tissue, appears to be healing well there is large hyperkeratosis, central necrotic patch, surrounding this no cellulitis purulence or malodor there is slight serous drainage and tenderness to the area.  Left heel has healed       Radiographs: Multiple views x-ray of both feet: I reviewed his previous radiographs taken by his endocrinologist on 05/22/2022 there is no evidence of osteomyelitis or soft tissue emphysema Assessment:   1. 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  Ulcer bilateral heel -We discussed the etiology and factors that are a part of the wound healing process.  We also discussed the risk of infection both soft tissue and osteomyelitis from open ulceration.  Discussed the risk of limb loss if this happens or worsens. -Debridement as below. -Dressed with Iodosorb, DSD.  He has been using this at home and he will continue this as well -Continue home dressing changes daily with foam border dressing and Prisma collagen.  Order sent to [facility] for this. -Continue off-loading is much as possible, I think he can continue his  regular shoe gear which is a soft open-ended croc type sandal.  We discussed avoiding exercise on his feet for now until these heal -Vascular testing is not indicated he has palpable pulses -HgbA1c: Well-controlled -Last antibiotics: Has completed 2 rounds of antibiotics, I do not think he requires further oral antibiotics at this point -Imaging: x-ray reviewed, shows no signs of erosions, osteolysis, osteomyelitis or emphysema.  Procedure: Excisional Debridement of Wound Rationale: Removal of non-viable soft tissue from the wound to promote healing.  Anesthesia: none Post-Debridement Wound Measurements: 3.2x2.2 cm x 0.2 cm on the right heel, Type of Debridement: Sharp Excisional Tissue Removed: Non-viable soft tissue Depth of Debridement: subcutaneous tissue. Technique: Sharp excisional debridement to bleeding, viable wound base.  Dressing: Dry, sterile, compression dressing. Disposition: Patient tolerated procedure well.       Return in about 4 weeks (around 07/21/2022) for wound care.

## 2022-06-25 NOTE — Telephone Encounter (Signed)
Called [facility] and spoke with Taylor,no orders for this patient in system, has to be refaxed

## 2022-06-25 NOTE — Telephone Encounter (Signed)
Resent to [facility] again right before lunch time today.

## 2022-06-29 DIAGNOSIS — E1140 Type 2 diabetes mellitus with diabetic neuropathy, unspecified: Secondary | ICD-10-CM | POA: Diagnosis not present

## 2022-06-29 DIAGNOSIS — E11621 Type 2 diabetes mellitus with foot ulcer: Secondary | ICD-10-CM | POA: Diagnosis not present

## 2022-06-29 DIAGNOSIS — E119 Type 2 diabetes mellitus without complications: Secondary | ICD-10-CM | POA: Diagnosis not present

## 2022-06-29 DIAGNOSIS — N182 Chronic kidney disease, stage 2 (mild): Secondary | ICD-10-CM | POA: Diagnosis not present

## 2022-07-21 ENCOUNTER — Ambulatory Visit: Payer: BC Managed Care – PPO | Admitting: Podiatry

## 2022-07-21 DIAGNOSIS — L97412 Non-pressure chronic ulcer of right heel and midfoot with fat layer exposed: Secondary | ICD-10-CM

## 2022-07-21 NOTE — Progress Notes (Signed)
  Subjective:  Patient ID: Gregory Rasmussen, male    DOB: Apr 30, 1956,  MRN: [id number]  Chief Complaint  Patient presents with   Foot Ulcer    4 week follow up right heel    66 y.o. male presents with the above complaint. History confirmed with patient.  Continues to do well he has been using the silver patches  Objective:  Physical Exam: warm, good capillary refill, no trophic changes or ulcerative lesions, normal DP and PT pulses, normal monofilament exam, normal sensory exam, and right heel ulceration noted below measuring 1.5x1.1 x 0.2 cm with exposed subcutaneous tissue, appears to be healing well there is large hyperkeratosis, central necrotic patch, surrounding this no cellulitis purulence or malodor there is slight serous drainage and tenderness to the area.  Left heel has healed       Radiographs: Multiple views x-ray of both feet: I reviewed his previous radiographs taken by his endocrinologist on 05/22/2022 there is no evidence of osteomyelitis or soft tissue emphysema Assessment:   1. 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  Ulcer bilateral heel -We discussed the etiology and factors that are a part of the wound healing process.  We also discussed the risk of infection both soft tissue and osteomyelitis from open ulceration.  Discussed the risk of limb loss if this happens or worsens. -Debridement as below.  Doing very well and is improved about 50% today -Dressed with Prisma, foam border dressing -Continue home dressing changes daily with silver dressings -Continue off-loading is much as possible  -Vascular testing is not indicated he has palpable pulses -HgbA1c: Well-controlled -Last antibiotics: Has completed 2 rounds of antibiotics, I do not think he requires further oral antibiotics at this point -Imaging: x-ray reviewed, shows no signs of erosions, osteolysis,  osteomyelitis or emphysema.  Procedure: Excisional Debridement of Wound Rationale: Removal of non-viable soft tissue from the wound to promote healing.  Anesthesia: none Post-Debridement Wound Measurements: Noted above Type of Debridement: Sharp Excisional Tissue Removed: Non-viable soft tissue Depth of Debridement: subcutaneous tissue. Technique: Sharp excisional debridement to bleeding, viable wound base.  Dressing: Dry, sterile, compression dressing. Disposition: Patient tolerated procedure well.       Return in about 4 weeks (around 08/18/2022) for wound care.

## 2022-08-18 ENCOUNTER — Ambulatory Visit: Payer: BC Managed Care – PPO | Admitting: Podiatry

## 2022-08-18 DIAGNOSIS — L97411 Non-pressure chronic ulcer of right heel and midfoot limited to breakdown of skin: Secondary | ICD-10-CM | POA: Diagnosis not present

## 2022-08-19 NOTE — Progress Notes (Signed)
  Subjective:  Patient ID: Gregory Rasmussen, male    DOB: June 05, 1956,  MRN: [id number]  Chief Complaint  Patient presents with   Foot Ulcer    Follow up right heel    66 y.o. male presents with the above complaint. History confirmed with patient.  Continues to do well   Objective:  Physical Exam: warm, good capillary refill, no trophic changes or ulcerative lesions, normal DP and PT pulses, normal monofilament exam, normal sensory exam, and right heel ulceration is nearly fully healed small area of limited skin breakdown some surrounding hyperkeratosis.  Left heel has healed       Radiographs: Multiple views x-ray of both feet: I reviewed his previous radiographs taken by his endocrinologist on 05/22/2022 there is no evidence of osteomyelitis or soft tissue emphysema Assessment:   1. 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  Ulcer bilateral heel -Overall doing very well his left heel remains well-healed, the right heel is nearly fully healed.  He can leave open to air at this point should heal uneventfully.  We discussed will take some time for the tissue to mature and scar tissue to resolve and sensitivity and pain to improve.  He will return to see me as needed at this point      Return if symptoms worsen or fail to improve.

## 2022-09-15 DIAGNOSIS — N1831 Chronic kidney disease, stage 3a: Secondary | ICD-10-CM | POA: Diagnosis not present

## 2022-09-15 DIAGNOSIS — H9313 Tinnitus, bilateral: Secondary | ICD-10-CM | POA: Diagnosis not present

## 2022-09-15 DIAGNOSIS — Z0000 Encounter for general adult medical examination without abnormal findings: Secondary | ICD-10-CM | POA: Diagnosis not present

## 2022-09-15 DIAGNOSIS — I10 Essential (primary) hypertension: Secondary | ICD-10-CM | POA: Diagnosis not present

## 2022-09-15 DIAGNOSIS — Z125 Encounter for screening for malignant neoplasm of prostate: Secondary | ICD-10-CM | POA: Diagnosis not present

## 2022-09-15 DIAGNOSIS — R21 Rash and other nonspecific skin eruption: Secondary | ICD-10-CM | POA: Diagnosis not present

## 2022-09-15 DIAGNOSIS — E782 Mixed hyperlipidemia: Secondary | ICD-10-CM | POA: Diagnosis not present

## 2022-09-15 DIAGNOSIS — E1169 Type 2 diabetes mellitus with other specified complication: Secondary | ICD-10-CM | POA: Diagnosis not present

## 2022-09-29 DIAGNOSIS — N182 Chronic kidney disease, stage 2 (mild): Secondary | ICD-10-CM | POA: Diagnosis not present

## 2022-09-29 DIAGNOSIS — E11621 Type 2 diabetes mellitus with foot ulcer: Secondary | ICD-10-CM | POA: Diagnosis not present

## 2022-09-29 DIAGNOSIS — E1142 Type 2 diabetes mellitus with diabetic polyneuropathy: Secondary | ICD-10-CM | POA: Diagnosis not present

## 2022-10-14 DIAGNOSIS — H9042 Sensorineural hearing loss, unilateral, left ear, with unrestricted hearing on the contralateral side: Secondary | ICD-10-CM | POA: Diagnosis not present

## 2022-10-14 DIAGNOSIS — H6121 Impacted cerumen, right ear: Secondary | ICD-10-CM | POA: Diagnosis not present

## 2022-10-14 DIAGNOSIS — H9312 Tinnitus, left ear: Secondary | ICD-10-CM | POA: Diagnosis not present

## 2022-10-20 ENCOUNTER — Other Ambulatory Visit: Payer: Self-pay | Admitting: Otolaryngology

## 2022-10-20 DIAGNOSIS — H918X3 Other specified hearing loss, bilateral: Secondary | ICD-10-CM

## 2022-11-17 ENCOUNTER — Ambulatory Visit
Admission: RE | Admit: 2022-11-17 | Discharge: 2022-11-17 | Disposition: A | Discharge status: Home / Self Care | Payer: BC Managed Care – PPO | Source: Ambulatory Visit | Attending: Otolaryngology | Admitting: Otolaryngology

## 2022-11-17 DIAGNOSIS — I6782 Cerebral ischemia: Secondary | ICD-10-CM | POA: Diagnosis not present

## 2022-11-17 DIAGNOSIS — H918X3 Other specified hearing loss, bilateral: Secondary | ICD-10-CM

## 2022-11-17 MED ORDER — GADOPICLENOL 0.5 MMOL/ML IV SOLN
10.0000 mL | Freq: Once | INTRAVENOUS | Status: AC | PRN
  Administered 2022-11-17: 10 mL via INTRAVENOUS

## 2022-11-26 DIAGNOSIS — H9042 Sensorineural hearing loss, unilateral, left ear, with unrestricted hearing on the contralateral side: Secondary | ICD-10-CM | POA: Diagnosis not present

## 2022-11-26 DIAGNOSIS — D333 Benign neoplasm of cranial nerves: Secondary | ICD-10-CM | POA: Diagnosis not present

## 2022-11-30 DIAGNOSIS — H9042 Sensorineural hearing loss, unilateral, left ear, with unrestricted hearing on the contralateral side: Secondary | ICD-10-CM | POA: Diagnosis not present

## 2022-12-01 DIAGNOSIS — E11319 Type 2 diabetes mellitus with unspecified diabetic retinopathy without macular edema: Secondary | ICD-10-CM | POA: Diagnosis not present

## 2023-07-01 ENCOUNTER — Ambulatory Visit (INDEPENDENT_AMBULATORY_CARE_PROVIDER_SITE_OTHER): Payer: BC Managed Care – PPO

## 2023-10-04 DIAGNOSIS — N182 Chronic kidney disease, stage 2 (mild): Secondary | ICD-10-CM | POA: Diagnosis not present

## 2023-10-04 DIAGNOSIS — E1142 Type 2 diabetes mellitus with diabetic polyneuropathy: Secondary | ICD-10-CM | POA: Diagnosis not present

## 2023-10-05 DIAGNOSIS — E782 Mixed hyperlipidemia: Secondary | ICD-10-CM | POA: Diagnosis not present

## 2023-10-05 DIAGNOSIS — E1136 Type 2 diabetes mellitus with diabetic cataract: Secondary | ICD-10-CM | POA: Diagnosis not present

## 2023-10-05 DIAGNOSIS — E785 Hyperlipidemia, unspecified: Secondary | ICD-10-CM | POA: Diagnosis not present

## 2023-10-05 DIAGNOSIS — N1831 Chronic kidney disease, stage 3a: Secondary | ICD-10-CM | POA: Diagnosis not present

## 2023-10-05 DIAGNOSIS — M109 Gout, unspecified: Secondary | ICD-10-CM | POA: Diagnosis not present

## 2023-10-05 DIAGNOSIS — E1142 Type 2 diabetes mellitus with diabetic polyneuropathy: Secondary | ICD-10-CM | POA: Diagnosis not present

## 2023-10-05 DIAGNOSIS — E1169 Type 2 diabetes mellitus with other specified complication: Secondary | ICD-10-CM | POA: Diagnosis not present

## 2023-10-05 DIAGNOSIS — I10 Essential (primary) hypertension: Secondary | ICD-10-CM | POA: Diagnosis not present

## 2023-11-09 ENCOUNTER — Telehealth (INDEPENDENT_AMBULATORY_CARE_PROVIDER_SITE_OTHER): Payer: Self-pay | Admitting: Otolaryngology

## 2023-11-09 NOTE — Telephone Encounter (Signed)
 Confirmed appt & location [id number]

## 2023-11-10 ENCOUNTER — Ambulatory Visit (INDEPENDENT_AMBULATORY_CARE_PROVIDER_SITE_OTHER): Payer: Self-pay | Admitting: Otolaryngology

## 2023-11-10 ENCOUNTER — Encounter (INDEPENDENT_AMBULATORY_CARE_PROVIDER_SITE_OTHER): Payer: Self-pay

## 2023-11-10 VITALS — BP 171/68 | HR 63 | Ht 70.0 in | Wt 225.0 lb

## 2023-11-10 DIAGNOSIS — H9042 Sensorineural hearing loss, unilateral, left ear, with unrestricted hearing on the contralateral side: Secondary | ICD-10-CM

## 2023-11-10 DIAGNOSIS — R42 Dizziness and giddiness: Secondary | ICD-10-CM

## 2023-11-10 DIAGNOSIS — D3610 Benign neoplasm of peripheral nerves and autonomic nervous system, unspecified: Secondary | ICD-10-CM | POA: Diagnosis not present

## 2023-11-10 DIAGNOSIS — H9192 Unspecified hearing loss, left ear: Secondary | ICD-10-CM

## 2023-11-10 DIAGNOSIS — H9312 Tinnitus, left ear: Secondary | ICD-10-CM | POA: Diagnosis not present

## 2023-11-10 DIAGNOSIS — D333 Benign neoplasm of cranial nerves: Secondary | ICD-10-CM

## 2023-11-12 DIAGNOSIS — R42 Dizziness and giddiness: Secondary | ICD-10-CM | POA: Insufficient documentation

## 2023-11-12 DIAGNOSIS — D333 Benign neoplasm of cranial nerves: Secondary | ICD-10-CM | POA: Insufficient documentation

## 2023-11-12 DIAGNOSIS — H9042 Sensorineural hearing loss, unilateral, left ear, with unrestricted hearing on the contralateral side: Secondary | ICD-10-CM | POA: Insufficient documentation

## 2023-11-12 NOTE — Progress Notes (Signed)
 Patient ID: Gregory Rasmussen, male   DOB: [birth year], 68 y.o.   MRN: [id number]  Follow-up: Left ear tinnitus, left ear hearing loss, left ear vestibular schwannoma  HPI: The patient is a 68 year old male who returns today for his follow-up evaluation.  The patient was last seen 1 year ago.  At that time, he was complaining of left ear tinnitus and left ear hearing loss.  He was noted to have asymmetric left ear sensorineural hearing loss.  His left ear tinnitus was likely a result of his hearing loss.  The patient was fitted with a left ear hearing aid.  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  The patient returns today complaining of increasing left ear tinnitus and left ear hearing loss.  He denies any spinning vertigo.  He has occasional balance difficulty.    Exam: General: Communicates without difficulty, well nourished, no acute distress. Head: Normocephalic, no evidence injury, no tenderness, facial buttresses intact without stepoff. Face/sinus: No tenderness to palpation and percussion. Facial movement is normal and symmetric. Eyes: PERRL, EOMI. No scleral icterus, conjunctivae clear. Neuro: CN II exam reveals vision grossly intact.  No nystagmus at any point of gaze. EAC: The ear canals and TMs are noted to be normal. Nose: External evaluation reveals normal support and skin without lesions.  Dorsum is intact.  Anterior rhinoscopy reveals pink mucosa over anterior aspect of inferior turbinates and intact septum.  No purulence noted. Oral:  Oral cavity and oropharynx are intact, symmetric, without erythema or edema.  Mucosa is moist without lesions. Neck: Full range of motion without pain.  There is no significant lymphadenopathy.  No masses palpable.  Thyroid bed within normal limits to palpation.  Parotid glands and submandibular glands equal bilaterally without mass.  Trachea is midline. Neuro:  CN 2-12 grossly intact. Gait normal.   Assessment: 1.  Left  ear vestibular schwannoma. 2.  Worsening asymmetric left ear hearing loss and left ear tinnitus. 3.  His ear canals, tympanic membranes, and middle ear spaces are normal.  Plan:  1.  The physical exam findings are reviewed with the patient. 2.  The pathophysiology of vestibular schwannoma is discussed with the patient. 3.  Repeat MRI scan to evaluate the stability of his left ear vestibular schwannoma. 3.  The treatment options for the vestibular schwannoma are discussed.  The options include conservative observation, surgical resection, or gamma knife radiation.  The pros and cons of the different treatment options are reviewed.  4.  The patient should continue the use of his left ear hearing aid. 5.  The patient will return for reevaluation after his MRI scan.

## 2023-11-15 ENCOUNTER — Ambulatory Visit (HOSPITAL_COMMUNITY)
Admission: RE | Admit: 2023-11-15 | Discharge: 2023-11-15 | Disposition: A | Discharge status: Home / Self Care | Source: Ambulatory Visit | Attending: Otolaryngology | Admitting: Otolaryngology

## 2023-11-15 DIAGNOSIS — D333 Benign neoplasm of cranial nerves: Secondary | ICD-10-CM | POA: Diagnosis not present

## 2023-11-15 DIAGNOSIS — I6782 Cerebral ischemia: Secondary | ICD-10-CM | POA: Diagnosis not present

## 2023-11-15 MED ORDER — GADOBUTROL 1 MMOL/ML IV SOLN
10.0000 mL | Freq: Once | INTRAVENOUS | Status: AC | PRN
  Administered 2023-11-15: 10 mL via INTRAVENOUS

## 2023-11-16 DIAGNOSIS — E113393 Type 2 diabetes mellitus with moderate nonproliferative diabetic retinopathy without macular edema, bilateral: Secondary | ICD-10-CM | POA: Diagnosis not present

## 2023-11-25 DIAGNOSIS — H35033 Hypertensive retinopathy, bilateral: Secondary | ICD-10-CM | POA: Diagnosis not present

## 2023-11-25 DIAGNOSIS — E113393 Type 2 diabetes mellitus with moderate nonproliferative diabetic retinopathy without macular edema, bilateral: Secondary | ICD-10-CM | POA: Diagnosis not present

## 2023-11-25 DIAGNOSIS — H43823 Vitreomacular adhesion, bilateral: Secondary | ICD-10-CM | POA: Diagnosis not present

## 2023-12-02 ENCOUNTER — Telehealth (INDEPENDENT_AMBULATORY_CARE_PROVIDER_SITE_OTHER): Payer: Self-pay | Admitting: Otolaryngology

## 2023-12-02 NOTE — Telephone Encounter (Signed)
 Patient called on 04/03 to confirm appt with Dr. Suszanne Conners on 04/09 at 2:10pm

## 2023-12-08 ENCOUNTER — Ambulatory Visit (INDEPENDENT_AMBULATORY_CARE_PROVIDER_SITE_OTHER)

## 2023-12-08 ENCOUNTER — Encounter (INDEPENDENT_AMBULATORY_CARE_PROVIDER_SITE_OTHER): Payer: Self-pay

## 2023-12-08 VITALS — Ht 70.0 in | Wt 222.0 lb

## 2023-12-08 DIAGNOSIS — D333 Benign neoplasm of cranial nerves: Secondary | ICD-10-CM

## 2023-12-08 DIAGNOSIS — H9042 Sensorineural hearing loss, unilateral, left ear, with unrestricted hearing on the contralateral side: Secondary | ICD-10-CM

## 2023-12-08 DIAGNOSIS — H9192 Unspecified hearing loss, left ear: Secondary | ICD-10-CM | POA: Diagnosis not present

## 2023-12-08 DIAGNOSIS — H9312 Tinnitus, left ear: Secondary | ICD-10-CM | POA: Diagnosis not present

## 2023-12-10 NOTE — Progress Notes (Signed)
 Patient ID: Gregory Rasmussen, male   DOB: 27-Oct-1955, 68 y.o.   MRN: [id number]  Follow-up: Left ear vestibular schwannoma, left ear hearing loss and tinnitus  HPI: The patient is a 68 year old male who returns today for his follow-up evaluation.  The patient has a history of left ear tinnitus and left ear hearing loss.  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  At his last visit, he was complaining of persistent left ear hearing loss and tinnitus.  He underwent a repeat MRI scan.  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  The patient returns today reporting no change in his symptoms.  He denies any otalgia, otorrhea, or vertigo.  He is using a left ear hearing aid.  Exam: General: Communicates without difficulty, well nourished, no acute distress. Head: Normocephalic, no evidence injury, no tenderness, facial buttresses intact without stepoff. Face/sinus: No tenderness to palpation and percussion. Facial movement is normal and symmetric. Eyes: PERRL, EOMI. No scleral icterus, conjunctivae clear. Neuro: CN II exam reveals vision grossly intact.  No nystagmus at any point of gaze. EAC: The ear canals and TMs are noted to be normal. Nose: External evaluation reveals normal support and skin without lesions.  Dorsum is intact.  Anterior rhinoscopy reveals pink mucosa over anterior aspect of inferior turbinates and intact septum.  No purulence noted. Oral:  Oral cavity and oropharynx are intact, symmetric, without erythema or edema.  Mucosa is moist without lesions. Neck: Full range of motion without pain.  There is no significant lymphadenopathy.  No masses palpable.  Thyroid bed within normal limits to palpation.  Parotid glands and submandibular glands equal bilaterally without mass.  Trachea is midline. Neuro:  CN 2-12 grossly intact. Gait normal.    Assessment: 1.  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 2.  Persistent left ear hearing loss and left ear  tinnitus. 3.  His tympanic membrane's and middle ear spaces are otherwise normal.  Plan: 1.  The physical exam findings and the MRI images are reviewed with the patient. 2.  The treatment options for the vestibular schwannoma are again discussed.  Options include conservative observation, gamma knife radiation, or surgical resection. 3.  The patient would like to continue with conservative observation. 4.  Continue the use of his left ear hearing aid. 5.  The patient will return for reevaluation in 1 year sooner if needed. 6.  Repeat MRI scan in 1 year.

## 2023-12-13 ENCOUNTER — Ambulatory Visit (INDEPENDENT_AMBULATORY_CARE_PROVIDER_SITE_OTHER): Payer: Self-pay | Admitting: Audiology

## 2023-12-13 DIAGNOSIS — H9042 Sensorineural hearing loss, unilateral, left ear, with unrestricted hearing on the contralateral side: Secondary | ICD-10-CM

## 2023-12-13 NOTE — Progress Notes (Signed)
  [address], [address] [place], [place] [address] [phone]  Hearing Aid Check     Bryceton Hantz comes for a scheduled appointment for a hearing aid check.  Accompanied [id number]   Left  Hearing aid manufacturer [employer] Intent 2 miniRITE SN: [id number]  Hearing aid style Receiver in the ear  Hearing aid battery rechargeable  Receiver Changed it to 2-85  Used to use 1-60  Dome/ custom earpiece 8mm bass dome  Retention wire Yes (added today)  Warranty expiration date 12-17-2025  Loss and Damage   Additional accessories Expiration date   Initial fitting date 11-30-2022  Device was fit at: [facility]    Chief complaint: Patient reports the tinnitus in the left ear where he has a vestibular schwannoma that is followed up by Dr. Darlin Ehrlich.  Actions taken: Inspection of the device and listening check showed that it seemed to be working well. Changed the receiver length and strength. Connected it to the software, installed the firmware upgrade and made some slight increased the gain in the low frequencies.Patient reported it felt better in his ear and that it now felt like it was reducing the tinnitus perception.   Services fee: $0 was paid at checkout.    Recommend: Return for a hearing aid check . Recommend patient to ask his physician to refer him for a hearing test as a follow up.  Return for a hearing evaluation and to see an ENT, if concerns with hearing changes arise.    Allea Kassner MARIE LEROUX-MARTINEZ, AUD

## 2024-01-29 DIAGNOSIS — I10 Essential (primary) hypertension: Secondary | ICD-10-CM | POA: Diagnosis not present

## 2024-01-29 DIAGNOSIS — N182 Chronic kidney disease, stage 2 (mild): Secondary | ICD-10-CM | POA: Diagnosis not present

## 2024-01-29 DIAGNOSIS — N1831 Chronic kidney disease, stage 3a: Secondary | ICD-10-CM | POA: Diagnosis not present

## 2024-01-29 DIAGNOSIS — E1136 Type 2 diabetes mellitus with diabetic cataract: Secondary | ICD-10-CM | POA: Diagnosis not present

## 2024-02-28 DIAGNOSIS — E1136 Type 2 diabetes mellitus with diabetic cataract: Secondary | ICD-10-CM | POA: Diagnosis not present

## 2024-02-28 DIAGNOSIS — I10 Essential (primary) hypertension: Secondary | ICD-10-CM | POA: Diagnosis not present

## 2024-02-28 DIAGNOSIS — N1831 Chronic kidney disease, stage 3a: Secondary | ICD-10-CM | POA: Diagnosis not present

## 2024-02-28 DIAGNOSIS — N182 Chronic kidney disease, stage 2 (mild): Secondary | ICD-10-CM | POA: Diagnosis not present

## 2024-03-28 DIAGNOSIS — M109 Gout, unspecified: Secondary | ICD-10-CM | POA: Diagnosis not present

## 2024-03-28 DIAGNOSIS — Z0000 Encounter for general adult medical examination without abnormal findings: Secondary | ICD-10-CM | POA: Diagnosis not present

## 2024-03-28 DIAGNOSIS — E782 Mixed hyperlipidemia: Secondary | ICD-10-CM | POA: Diagnosis not present

## 2024-03-28 DIAGNOSIS — R42 Dizziness and giddiness: Secondary | ICD-10-CM | POA: Diagnosis not present

## 2024-03-28 DIAGNOSIS — N1831 Chronic kidney disease, stage 3a: Secondary | ICD-10-CM | POA: Diagnosis not present

## 2024-03-28 DIAGNOSIS — I10 Essential (primary) hypertension: Secondary | ICD-10-CM | POA: Diagnosis not present

## 2024-03-28 DIAGNOSIS — E1165 Type 2 diabetes mellitus with hyperglycemia: Secondary | ICD-10-CM | POA: Diagnosis not present

## 2024-03-28 DIAGNOSIS — G4700 Insomnia, unspecified: Secondary | ICD-10-CM | POA: Diagnosis not present

## 2024-03-29 DIAGNOSIS — E1165 Type 2 diabetes mellitus with hyperglycemia: Secondary | ICD-10-CM | POA: Diagnosis not present

## 2024-03-29 DIAGNOSIS — N182 Chronic kidney disease, stage 2 (mild): Secondary | ICD-10-CM | POA: Diagnosis not present

## 2024-03-29 DIAGNOSIS — E1142 Type 2 diabetes mellitus with diabetic polyneuropathy: Secondary | ICD-10-CM | POA: Diagnosis not present

## 2024-03-30 DIAGNOSIS — E1136 Type 2 diabetes mellitus with diabetic cataract: Secondary | ICD-10-CM | POA: Diagnosis not present

## 2024-03-30 DIAGNOSIS — N182 Chronic kidney disease, stage 2 (mild): Secondary | ICD-10-CM | POA: Diagnosis not present

## 2024-03-30 DIAGNOSIS — N1831 Chronic kidney disease, stage 3a: Secondary | ICD-10-CM | POA: Diagnosis not present

## 2024-03-30 DIAGNOSIS — I10 Essential (primary) hypertension: Secondary | ICD-10-CM | POA: Diagnosis not present

## 2024-04-25 ENCOUNTER — Telehealth (INDEPENDENT_AMBULATORY_CARE_PROVIDER_SITE_OTHER): Payer: Self-pay | Admitting: Audiology

## 2024-04-25 NOTE — Telephone Encounter (Signed)
 Patient called in and stated that he has lost his HA.  Under warranty.  Aware of $250 replacement fee. Do you need for patient to make an appointment?  Please advise.

## 2024-04-26 ENCOUNTER — Telehealth (INDEPENDENT_AMBULATORY_CARE_PROVIDER_SITE_OTHER): Payer: Self-pay | Admitting: Audiology

## 2024-04-26 NOTE — Telephone Encounter (Signed)
 Called patient to inform of $250.00 replacement fee for lost HA.  Patient agreed and gave authorization to place order for replacement HA.  Informed patient we will call when replacement has arrived and fee will be paid then.

## 2024-04-28 ENCOUNTER — Ambulatory Visit (INDEPENDENT_AMBULATORY_CARE_PROVIDER_SITE_OTHER): Payer: Self-pay | Admitting: Audiology

## 2024-04-28 DIAGNOSIS — H9042 Sensorineural hearing loss, unilateral, left ear, with unrestricted hearing on the contralateral side: Secondary | ICD-10-CM

## 2024-04-28 NOTE — Progress Notes (Signed)
  [address], [address] [place], [place] [address] [phone]  Hearing Aid Check     Gregory Rasmussen comes for a scheduled appointment for a hearing aid check.   Accompanied ab:[id number]     Left  Hearing aid manufacturer [employer] Intent 2 miniRITE SN: [id number]  Hearing aid style Receiver in the ear  Hearing aid battery rechargeable  Receiver Changed it to 2-85  Used to use 1-60  Dome/ custom earpiece 8mm double dome used to wear 8mm closed dome  Retention wire Yes (added today)  Warranty expiration date 12-17-2025  Loss and Damage  USED  Initial fitting date 11-30-2022  Device was fit at: [facility]        Chief complaint: Patient comes to pick up left hearing aid after it was replaced under L&D. He lost his left aid while jogging.  Actions taken: Programmed based on last settings, with some slight changes after changing the dome. Made changes to the tinnitus program until it was comfortable for the patient.  Services fee: $250 was paid at checkout.  Patient was oriented about letting me know if he is having retention issues.  Recommend: Return for a hearing aid check, as needed. Return for a hearing evaluation and to see an ENT, if concerns with hearing changes arise.    Gregory Rasmussen MARIE LEROUX-MARTINEZ, AUD .

## 2024-04-30 DIAGNOSIS — I10 Essential (primary) hypertension: Secondary | ICD-10-CM | POA: Diagnosis not present

## 2024-04-30 DIAGNOSIS — N182 Chronic kidney disease, stage 2 (mild): Secondary | ICD-10-CM | POA: Diagnosis not present

## 2024-04-30 DIAGNOSIS — N1831 Chronic kidney disease, stage 3a: Secondary | ICD-10-CM | POA: Diagnosis not present

## 2024-04-30 DIAGNOSIS — E1136 Type 2 diabetes mellitus with diabetic cataract: Secondary | ICD-10-CM | POA: Diagnosis not present

## 2024-05-02 DIAGNOSIS — Z860101 Personal history of adenomatous and serrated colon polyps: Secondary | ICD-10-CM | POA: Diagnosis not present

## 2024-05-02 DIAGNOSIS — K649 Unspecified hemorrhoids: Secondary | ICD-10-CM | POA: Diagnosis not present

## 2024-05-02 DIAGNOSIS — D123 Benign neoplasm of transverse colon: Secondary | ICD-10-CM | POA: Diagnosis not present

## 2024-05-02 DIAGNOSIS — Z09 Encounter for follow-up examination after completed treatment for conditions other than malignant neoplasm: Secondary | ICD-10-CM | POA: Diagnosis not present

## 2024-05-30 DIAGNOSIS — N182 Chronic kidney disease, stage 2 (mild): Secondary | ICD-10-CM | POA: Diagnosis not present

## 2024-05-30 DIAGNOSIS — I10 Essential (primary) hypertension: Secondary | ICD-10-CM | POA: Diagnosis not present

## 2024-05-30 DIAGNOSIS — N1831 Chronic kidney disease, stage 3a: Secondary | ICD-10-CM | POA: Diagnosis not present

## 2024-05-30 DIAGNOSIS — E1136 Type 2 diabetes mellitus with diabetic cataract: Secondary | ICD-10-CM | POA: Diagnosis not present

## 2024-06-21 DIAGNOSIS — E113393 Type 2 diabetes mellitus with moderate nonproliferative diabetic retinopathy without macular edema, bilateral: Secondary | ICD-10-CM | POA: Diagnosis not present

## 2024-06-21 DIAGNOSIS — H43823 Vitreomacular adhesion, bilateral: Secondary | ICD-10-CM | POA: Diagnosis not present

## 2024-06-21 DIAGNOSIS — H35033 Hypertensive retinopathy, bilateral: Secondary | ICD-10-CM | POA: Diagnosis not present

## 2024-11-01 ENCOUNTER — Ambulatory Visit (INDEPENDENT_AMBULATORY_CARE_PROVIDER_SITE_OTHER): Admitting: Otolaryngology
